# Patient Record
Sex: Male | Born: 1995 | Race: White | Hispanic: Yes | Marital: Single | Smoking: Never smoker
Health system: Southern US, Community
[De-identification: ages and names within clinical notes are randomized; demographics above are authoritative.]

---
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
## 2019-12-25 ENCOUNTER — Ambulatory Visit: Payer: Self-pay | Admitting: Physician Assistant

## 2019-12-25 ENCOUNTER — Other Ambulatory Visit: Payer: Self-pay

## 2019-12-25 DIAGNOSIS — Z113 Encounter for screening for infections with a predominantly sexual mode of transmission: Secondary | ICD-10-CM

## 2019-12-25 LAB — GRAM STAIN

## 2019-12-25 NOTE — Progress Notes (Signed)
Gram stain reviewed, no treatment indicated..Ruqayyah Lute Brewer-Jensen, RN  ?

## 2019-12-26 ENCOUNTER — Encounter: Payer: Self-pay | Admitting: Physician Assistant

## 2019-12-26 NOTE — Progress Notes (Signed)
   [facility] Department STI clinic/screening visit  Subjective:  Mike Cardenas is a 24 y.o. male being seen today for an STI screening visit. The patient reports they do have symptoms.    Patient has the following medical conditions:  There are no problems to display for this patient.    Chief Complaint  Patient presents with  . SEXUALLY TRANSMITTED DISEASE    screening    HPI  Patient reports that he had a "bump" that looked like a pimple and that he picked at it and pus came out.  States that he is concerned because it has not resolved.  Denies chronic conditions, surgeries and regular medications.  Last HIV test was 2 days ago per patient.    See flowsheet for further details and programmatic requirements.    The following portions of the patient's history were reviewed and updated as appropriate: allergies, current medications, past medical history, past social history, past surgical history and problem list.  Objective:  There were no vitals filed for this visit.  Physical Exam Constitutional:      General: He is not in acute distress.    Appearance: Normal appearance.  HENT:     Head: Normocephalic and atraumatic.     Comments: No nits, lice, or hair loss. No cervical, supraclavicular or axillary adenopathy.    Mouth/Throat:     Mouth: Mucous membranes are moist.     Pharynx: Oropharynx is clear. No oropharyngeal exudate or posterior oropharyngeal erythema.  Eyes:     Conjunctiva/sclera: Conjunctivae normal.  Pulmonary:     Effort: Pulmonary effort is normal.  Abdominal:     Palpations: Abdomen is soft. There is no mass.     Tenderness: There is no abdominal tenderness. There is no guarding or rebound.  Genitourinary:    Penis: Normal.      Testes: Normal.     Comments: Pubic area without nits, lice, edema, erythema, lesions and inguinal adenopathy. Penis uncircumcised, without rash or lesion and no discharge at meatus. Shaft of penis with 1  ~48mm slightly pinkish area where patient reports "bump" started. Musculoskeletal:     Cervical back: Neck supple. No tenderness.  Skin:    General: Skin is warm and dry.     Findings: No bruising, erythema, lesion or rash.  Neurological:     Mental Status: He is alert and oriented to person, place, and time.  Psychiatric:        Mood and Affect: Mood normal.        Behavior: Behavior normal.        Thought Content: Thought content normal.        Judgment: Judgment normal.       Assessment and Plan:  Mike Cardenas is a 24 y.o. male presenting to the [facility] Department for STI screening  1. Screening for STD (sexually transmitted disease) Patient into clinic with symptom.  Declines blood work today since had it done 2 days ago. Reassured patient that "bump" has healed but that due to inflammation, it can take a while for the area to go back to normal appearance. Rec condoms with all sex. Await test results.  Counseled that RN will call if needs to RTC for treatment once results are back.  - Gram stain - Gonococcus culture     No follow-ups on file.  No future appointments.  Matt Holmes, PA

## 2019-12-30 LAB — GONOCOCCUS CULTURE

## 2020-01-13 ENCOUNTER — Telehealth: Payer: Self-pay | Admitting: Family Medicine

## 2020-01-14 NOTE — Telephone Encounter (Signed)
Phone call to pt. Pt knew password from last visit and was given TR from last visit.

## 2020-12-29 ENCOUNTER — Emergency Department
Admission: EM | Admit: 2020-12-29 | Discharge: 2020-12-29 | Disposition: A | Discharge status: Home / Self Care | Payer: Self-pay | Attending: Emergency Medicine | Admitting: Emergency Medicine

## 2020-12-29 ENCOUNTER — Other Ambulatory Visit: Payer: Self-pay

## 2020-12-29 DIAGNOSIS — S61215A Laceration without foreign body of left ring finger without damage to nail, initial encounter: Secondary | ICD-10-CM | POA: Insufficient documentation

## 2020-12-29 DIAGNOSIS — Z23 Encounter for immunization: Secondary | ICD-10-CM | POA: Insufficient documentation

## 2020-12-29 DIAGNOSIS — X788XXA Intentional self-harm by other sharp object, initial encounter: Secondary | ICD-10-CM | POA: Insufficient documentation

## 2020-12-29 MED ORDER — TETANUS-DIPHTH-ACELL PERTUSSIS 5-2.5-18.5 LF-MCG/0.5 IM SUSY
0.5000 mL | PREFILLED_SYRINGE | Freq: Once | INTRAMUSCULAR | Status: AC
  Administered 2020-12-29: 0.5 mL via INTRAMUSCULAR
  Filled 2020-12-29: qty 0.5

## 2020-12-29 NOTE — ED Notes (Signed)
Patient denies redness, itching or swelling to left deltoid. Patient denies pain. Patient ambulatory to exit.

## 2020-12-29 NOTE — ED Triage Notes (Signed)
Pt to ED for laceration to left ring finger on box cutter trying to open box Unsure of last tetanus shot Small lac noted to top of left ring finger, no bleeding at this time

## 2020-12-29 NOTE — Discharge Instructions (Addendum)
Keep wound clean and dry for the next 24 hours. Her tetanus status was updated in the emergency department today. Please return if you notice any redness around the wound.

## 2020-12-29 NOTE — ED Provider Notes (Signed)
[facility]-EMERGENCY DEPARTMENT  ____________________________________________  Time seen: Approximately 5:51 PM  I have reviewed the triage vital signs and the nursing notes.   HISTORY  Chief Complaint Laceration   Historian Patient     HPI Mike Cardenas is a 25 y.o. male presents to the emergency department with a 2 cm dorsal left ring finger laceration after accidentally cutting himself approximately.  He is unsure of his last tetanus shot.  No numbness or tingling..  No similar injuries in the past   History reviewed. No pertinent past medical history.   Immunizations up to date:  Yes.     History reviewed. No pertinent past medical history.  There are no problems to display for this patient.   History reviewed. No pertinent surgical history.  Prior to Admission medications   Not on File    Allergies Patient has no known allergies.  No family history on file.  Social History Social History   Tobacco Use   Smoking status: Never   Smokeless tobacco: Never  Substance Use Topics   Alcohol use: Yes    Comment: 2-3 drinks per week   Drug use: Not Currently    Types: Marijuana, Cocaine     Review of Systems  Constitutional: No fever/chills Eyes:  No discharge ENT: No upper respiratory complaints. Respiratory: no cough. No SOB/ use of accessory muscles to breath Gastrointestinal:   No nausea, no vomiting.  No diarrhea.  No constipation. Musculoskeletal: Negative for musculoskeletal pain. Skin: Patient has laceration.     ____________________________________________   PHYSICAL EXAM:  VITAL SIGNS: ED Triage Vitals [12/29/20 1632]  Enc Vitals Group     BP (!) 154/94     Pulse Rate 71     Resp 18     Temp 98.6 F (37 C)     Temp Source Oral     SpO2 99 %     Weight 230 lb (104.3 kg)     Height 5\' 6"  (1.676 m)     Head Circumference      Peak Flow      Pain Score 5     Pain Loc      Pain Edu?      Excl. in GC?      Constitutional:  Alert and oriented. Well appearing and in no acute distress. Eyes: Conjunctivae are normal. PERRL. EOMI. Head: Atraumatic. ENT: Cardiovascular: Normal rate, regular rhythm. Normal S1 and S2.  Good peripheral circulation. Respiratory: Normal respiratory effort without tachypnea or retractions. Lungs CTAB. Good air entry to the bases with no decreased or absent breath sounds Gastrointestinal: Bowel sounds x 4 quadrants. Soft and nontender to palpation. No guarding or rigidity. No distention. Musculoskeletal: Full range of motion to all extremities. No obvious deformities noted Neurologic:  Normal for age. No gross focal neurologic deficits are appreciated.  Skin: Patient has 2 cm well approximated linear laceration along the dorsal aspect of the left ring finger.  Laceration does not cross the joint. Psychiatric: Mood and affect are normal for age. Speech and behavior are normal.   ____________________________________________   LABS (all labs ordered are listed, but only abnormal results are displayed)  Labs Reviewed - No data to display ____________________________________________  EKG   ____________________________________________  RADIOLOGY   No results found.  ____________________________________________    PROCEDURES  Procedure(s) performed:     Marland KitchenLaceration Repair  Date/Time: 12/29/2020 5:53 PM Performed by: 12/31/2020, PA-C Authorized by: Orvil Feil, Orvil Feil   Consent:    Consent  obtained:  Verbal   Risks discussed:  Infection and pain Universal protocol:    Procedure explained and questions answered to patient or proxy's satisfaction: yes     Patient identity confirmed:  Verbally with patient Anesthesia:    Anesthesia method:  None Laceration details:    Location:  Finger   Finger location:  L ring finger   Length (cm):  2 Pre-procedure details:    Preparation:  Patient was prepped and draped in usual sterile fashion Treatment:    Amount of  cleaning:  Standard   Debridement:  None Skin repair:    Repair method:  Tissue adhesive Approximation:    Approximation:  Close Repair type:    Repair type:  Simple     Medications  Tdap (BOOSTRIX) injection 0.5 mL (has no administration in time range)     ____________________________________________   INITIAL IMPRESSION / ASSESSMENT AND PLAN / ED COURSE  Pertinent labs & imaging results that were available during my care of the patient were reviewed by me and considered in my medical decision making (see chart for details).      Assessment and Plan Finger Laceration:  25 year old male presents to the emergency department with a 2 cm laceration along the dorsal aspect of the left ring finger.  Laceration was repaired in the emergency department without complication.  He was advised to follow-up with primary care as needed.  Tetanus status updated prior to discharge.      ____________________________________________  FINAL CLINICAL IMPRESSION(S) / ED DIAGNOSES  Final diagnoses:  Laceration of left ring finger without foreign body without damage to nail, initial encounter      NEW MEDICATIONS STARTED DURING THIS VISIT:  ED Discharge Orders     None           This chart was dictated using voice recognition software/Dragon. Despite best efforts to proofread, errors can occur which can change the meaning. Any change was purely unintentional.     Orvil Feil, PA-C 12/29/20 1757    Willy Eddy, MD 12/29/20 [phone]

## 2021-12-20 ENCOUNTER — Ambulatory Visit: Payer: Self-pay | Admitting: Family Medicine

## 2021-12-20 ENCOUNTER — Encounter: Payer: Self-pay | Admitting: Family Medicine

## 2021-12-20 DIAGNOSIS — N341 Nonspecific urethritis: Secondary | ICD-10-CM

## 2021-12-20 DIAGNOSIS — Z113 Encounter for screening for infections with a predominantly sexual mode of transmission: Secondary | ICD-10-CM

## 2021-12-20 DIAGNOSIS — L740 Miliaria rubra: Secondary | ICD-10-CM

## 2021-12-20 LAB — HM HIV SCREENING LAB: HM HIV Screening: NEGATIVE

## 2021-12-20 LAB — HM HEPATITIS C SCREENING LAB: HM Hepatitis Screen: NEGATIVE

## 2021-12-20 LAB — GRAM STAIN

## 2021-12-20 LAB — HEPATITIS B SURFACE ANTIGEN: Hepatitis B Surface Ag: NONREACTIVE

## 2021-12-20 MED ORDER — DOXYCYCLINE HYCLATE 100 MG PO TABS: 100.0000 mg | ORAL_TABLET | Freq: Two times a day (BID) | ORAL | 0 refills | Status: AC

## 2021-12-20 NOTE — Progress Notes (Signed)
GRAM STAIN reveals NGU. Treated per standing orders. Contact card given. Condoms declined. Verbalized understanding of other labwork pending. Lethea Killings RN

## 2021-12-20 NOTE — Progress Notes (Signed)
[facility] Department STI clinic/screening visit  Subjective:  Mike Cardenas is a 26 y.o. male being seen today for an STI screening visit. The patient reports they do have symptoms.    Patient has the following medical conditions:  There are no problems to display for this patient.    Chief Complaint  Patient presents with   SEXUALLY TRANSMITTED DISEASE    STI screening. Endorses "bumps around mouth" and rash to groin/upper thigh area    HPI  Patient reports here for screening, reports having tiny bumps on inside of lips and rash on inner thigh   Does the patient or their partner desires a pregnancy in the next year? No  Screening for MPX risk: Does the patient have an unexplained rash? No Is the patient MSM? No Does the patient endorse multiple sex partners or anonymous sex partners? No Did the patient have close or sexual contact with a person diagnosed with MPX? No Has the patient traveled outside the [place] where MPX is endemic? No Is there a high clinical suspicion for MPX-- evidenced by one of the following No  -Unlikely to be chickenpox  -Lymphadenopathy  -Rash that present in same phase of evolution on any given body part   See flowsheet for further details and programmatic requirements.   Immunization History  Administered Date(s) Administered   Tdap 12/29/2020     The following portions of the patient's history were reviewed and updated as appropriate: allergies, current medications, past medical history, past social history, past surgical history and problem list.  Objective:  There were no vitals filed for this visit.  Physical Exam Constitutional:      Appearance: Normal appearance.  HENT:     Head: Normocephalic.     Mouth/Throat:     Mouth: Mucous membranes are moist.     Pharynx: Oropharynx is clear. No oropharyngeal exudate.  Pulmonary:     Effort: Pulmonary effort is normal.  Genitourinary:    Penis: Normal.      Testes: Normal.      Comments: No lice, nits, or pest, no lesions or odor discharge.  Denies pain or tenderness with paplation of testicles.  No lesions, ulcers or masses present.    Musculoskeletal:     Cervical back: Normal range of motion.  Lymphadenopathy:     Cervical: No cervical adenopathy.  Skin:    General: Skin is warm and dry.     Findings: No bruising, erythema, lesion or rash.     Comments: Skin chafing from thighs rubbing   Neurological:     General: No focal deficit present.     Mental Status: He is alert and oriented to person, place, and time.  Psychiatric:        Mood and Affect: Mood normal.        Behavior: Behavior normal.       Assessment and Plan:  Frutoso Dimare is a 26 y.o. male presenting to the [facility] Department for STI screening  1. Screening examination for venereal disease Patient does have STI symptoms Patient accepted all screenings including  gram stain, urine, rectal CT/GC and bloodwork for HIV/RPR.  Patient meets criteria for HepB screening? Yes. Ordered? Yes Patient meets criteria for HepC screening? Yes. Ordered? Yes Recommended condom use with all sex Discussed importance of condom use for STI prevent  Treat gram stain per standing order Discussed time line for State Lab results and that patient will be called with positive results and encouraged patient  to call if he had not heard in 2 weeks Recommended returning for continued or worsening symptoms.   - Gonococcus culture - Gram stain - HBV Antigen/Antibody State Lab - HIV/HCV [place] [facility] - Syphilis Serology, [place] [facility]  2. NGU (nongonococcal urethritis)  - doxycycline (VIBRA-TABS) 100 MG tablet; Take 1 tablet (100 mg total) by mouth 2 (two) times daily.  Dispense: 14 tablet; Refill: 0  3. Heat rash Rash on thigh seems to  be related to heat and friction Discussed the follow ing methods to reduce and prevent heat rash.  Cool down. Use cool compresses to bring down the temperature of  the affected skin. Dry off.Keep the irritated skin dry. Use a fan to dry the skin off faster and to reduce sweating. Reduce friction.Wear loose clothes to prevent irritation caused by clothing that rubs against the skin    No follow-ups on file.  No future appointments.  Wendi Snipes, FNP

## 2021-12-24 LAB — GONOCOCCUS CULTURE

## 2022-01-12 ENCOUNTER — Telehealth: Payer: Self-pay | Admitting: Family Medicine

## 2022-01-12 NOTE — Telephone Encounter (Signed)
Pt notified of negative HIV, Hep B, Hep C, Syphilis and GC culture results.

## 2022-01-12 NOTE — Telephone Encounter (Signed)
Pt would like to speak to a nurse regarding his test results.

## 2022-01-23 ENCOUNTER — Ambulatory Visit: Payer: Self-pay | Admitting: Nurse Practitioner

## 2022-01-23 ENCOUNTER — Encounter: Payer: Self-pay | Admitting: Nurse Practitioner

## 2022-01-23 DIAGNOSIS — Z113 Encounter for screening for infections with a predominantly sexual mode of transmission: Secondary | ICD-10-CM

## 2022-01-23 NOTE — Progress Notes (Signed)
[facility] Department STI clinic/screening visit  Subjective:  Brysen Shankman is a 26 y.o. male being seen today for an STI screening visit. The patient reports they do not have symptoms.    Patient has the following medical conditions:  There are no problems to display for this patient.    Chief Complaint  Patient presents with   SEXUALLY TRANSMITTED DISEASE    Screening    HPI  Patient reports to clinic today for STD screening.  Patient reports that one month ago he was diagnosed with NGU.  Completed all treatment given for NGU.  Desires to be rescreened today.   Does the patient or their partner desires a pregnancy in the next year? No  Screening for MPX risk: Does the patient have an unexplained rash? No Is the patient MSM? No Does the patient endorse multiple sex partners or anonymous sex partners? No Did the patient have close or sexual contact with a person diagnosed with MPX? No Has the patient traveled outside the [place] where MPX is endemic? No Is there a high clinical suspicion for MPX-- evidenced by one of the following No  -Unlikely to be chickenpox  -Lymphadenopathy  -Rash that present in same phase of evolution on any given body part   See flowsheet for further details and programmatic requirements.   Immunization History  Administered Date(s) Administered   Tdap 12/29/2020     The following portions of the patient's history were reviewed and updated as appropriate: allergies, current medications, past medical history, past social history, past surgical history and problem list.  Objective:  There were no vitals filed for this visit.  Physical Exam Constitutional:      Appearance: Normal appearance. He is obese.  HENT:     Head: Normocephalic. No abrasion, masses or laceration. Hair is normal.     Mouth/Throat:     Mouth: No oral lesions.     Dentition: No dental caries.     Pharynx: No pharyngeal swelling, oropharyngeal exudate, posterior  oropharyngeal erythema or uvula swelling.     Tonsils: No tonsillar exudate or tonsillar abscesses.  Eyes:     General: Lids are normal.        Right eye: No discharge.        Left eye: No discharge.     Conjunctiva/sclera: Conjunctivae normal.     Right eye: No exudate.    Left eye: No exudate. Abdominal:     General: Abdomen is flat.     Palpations: Abdomen is soft.     Tenderness: There is no abdominal tenderness. There is no rebound.  Genitourinary:    Pubic Area: No rash or pubic lice.      Penis: Normal and circumcised. No erythema or discharge.      Testes: Normal.        Right: Mass or tenderness not present.        Left: Mass or tenderness not present.     Rectum: Normal.     Comments: Discharge amount: None Color: None Musculoskeletal:     Cervical back: Full passive range of motion without pain, normal range of motion and neck supple.  Lymphadenopathy:     Cervical: No cervical adenopathy.     Right cervical: No superficial, deep or posterior cervical adenopathy.    Left cervical: No superficial, deep or posterior cervical adenopathy.     Upper Body:     Right upper body: No supraclavicular, axillary or epitrochlear adenopathy.  Left upper body: No supraclavicular, axillary or epitrochlear adenopathy.     Lower Body: No right inguinal adenopathy. No left inguinal adenopathy.  Skin:    General: Skin is warm and dry.     Findings: No lesion or rash.  Neurological:     Mental Status: He is alert and oriented to person, place, and time.  Psychiatric:        Attention and Perception: Attention normal.        Mood and Affect: Mood normal.        Speech: Speech normal.        Behavior: Behavior normal. Behavior is cooperative.       Assessment and Plan:  Skeeter Sheard is a 26 y.o. male presenting to the [facility] Department for STI screening  1. Screening examination for venereal disease -26 year old male in clinic today for STD  screening. -Patient does not have STI symptoms Patient accepted all screenings including urine CT/GC, gram stain and declines bloodwork for HIV/RPR.  Patient meets criteria for HepB screening? Yes. Ordered? No - refused Patient meets criteria for HepC screening? Yes. Ordered? No - refused Recommended condom use with all sex Discussed importance of condom use for STI prevent  Treat gram stain per standing order Discussed time line for State Lab results and that patient will be called with positive results and encouraged patient to call if he had not heard in 2 weeks Recommended returning for continued or worsening symptoms.    - Gram stain - Chlamydia/GC NAA, Confirmation     Return if symptoms worsen or fail to improve.   Glenna Fellows, FNP

## 2022-01-23 NOTE — Progress Notes (Signed)
Pt here for STD screening.  Gram stain results reviewed, no treatment required.  Condoms declined.  Sipriano Fendley M Tonji Elliff, RN  

## 2022-01-24 LAB — GRAM STAIN

## 2022-01-26 LAB — CHLAMYDIA/GC NAA, CONFIRMATION
Chlamydia trachomatis, NAA: NEGATIVE
Neisseria gonorrhoeae, NAA: NEGATIVE
